# Patient Record
Sex: Female | Born: 1970 | Race: Black or African American | Hispanic: No | Marital: Single | Smoking: Never smoker
Health system: Southern US, Community
[De-identification: ages and names within clinical notes are randomized; demographics above are authoritative.]

## PROBLEM LIST (undated history)

## (undated) DIAGNOSIS — E039 Hypothyroidism, unspecified: Secondary | ICD-10-CM

## (undated) DIAGNOSIS — R319 Hematuria, unspecified: Secondary | ICD-10-CM

## (undated) DIAGNOSIS — K5090 Crohn's disease, unspecified, without complications: Secondary | ICD-10-CM

## (undated) HISTORY — DX: Hypothyroidism, unspecified: E03.9

## (undated) HISTORY — DX: Hematuria, unspecified: R31.9

## (undated) HISTORY — DX: Crohn's disease, unspecified, without complications: K50.90

## (undated) HISTORY — PX: ABDOMINAL HYSTERECTOMY: SHX81

## (undated) HISTORY — PX: BREAST EXCISIONAL BIOPSY: SUR124

---

## 2006-12-14 ENCOUNTER — Ambulatory Visit: Payer: Self-pay | Admitting: Family Medicine

## 2010-07-25 ENCOUNTER — Ambulatory Visit: Payer: Self-pay | Admitting: Obstetrics & Gynecology

## 2010-07-29 ENCOUNTER — Ambulatory Visit: Payer: Self-pay | Admitting: Obstetrics & Gynecology

## 2010-11-06 ENCOUNTER — Ambulatory Visit: Payer: Self-pay | Admitting: Family Medicine

## 2011-11-11 ENCOUNTER — Ambulatory Visit: Payer: Self-pay | Admitting: Family Medicine

## 2012-03-31 ENCOUNTER — Ambulatory Visit: Payer: Self-pay | Admitting: Urology

## 2012-11-03 ENCOUNTER — Ambulatory Visit: Payer: Self-pay | Admitting: Family Medicine

## 2013-03-28 ENCOUNTER — Other Ambulatory Visit (INDEPENDENT_AMBULATORY_CARE_PROVIDER_SITE_OTHER): Payer: BC Managed Care – PPO

## 2013-03-28 ENCOUNTER — Encounter: Payer: Self-pay | Admitting: General Surgery

## 2013-03-28 ENCOUNTER — Ambulatory Visit (INDEPENDENT_AMBULATORY_CARE_PROVIDER_SITE_OTHER): Payer: BC Managed Care – PPO | Admitting: General Surgery

## 2013-03-28 VITALS — BP 136/74 | HR 76 | Resp 14 | Ht 65.0 in | Wt 138.0 lb

## 2013-03-28 DIAGNOSIS — N6459 Other signs and symptoms in breast: Secondary | ICD-10-CM

## 2013-03-28 DIAGNOSIS — N6452 Nipple discharge: Secondary | ICD-10-CM

## 2013-03-28 NOTE — Progress Notes (Signed)
Patient ID: Amber Navarro, female   DOB: [birth year], 43 y.o.   MRN: [id number]  Chief Complaint  Patient presents with  . Follow-up    nipple bleeding    HPI Amber Navarro is a 43 y.o. female here today for an evaluation of left nipple bleeding. She states she noticed this about  Six months ago. Patient states it is dark brown with an red tent. She says it drains daily .  Patient last mammogram was October 19,2014. Patient does not perform self breast checks and get regular mammograms.  HPI  Past Medical History  Diagnosis Date  . Hypothyroidism   . Crohn disease   . Blood in urine     Past Surgical History  Procedure Laterality Date  . Abdominal hysterectomy      Family History  Problem Relation Age of Onset  . Cancer Father     brain   . Hypertension Mother     Social History History  Substance Use Topics  . Smoking status: Never Smoker   . Smokeless tobacco: Never Used  . Alcohol Use: Yes    No Known Allergies  Current Outpatient Prescriptions  Medication Sig Dispense Refill  . folic acid (FOLVITE) 1 MG tablet Take 1 mg by mouth daily.      Marland Kitchen levothyroxine (SYNTHROID, LEVOTHROID) 88 MCG tablet Take 88 mcg by mouth daily before breakfast.      . Mesalamine (DELZICOL) 400 MG CPDR DR capsule Take 400 mg by mouth 2 (two) times daily.       No current facility-administered medications for this visit.    Review of Systems Review of Systems  Constitutional: Negative.   Respiratory: Negative.   Cardiovascular: Negative.     Blood pressure 136/74, pulse 76, resp. rate 14, height 5\' 5"  (1.651 m), weight 138 lb (62.596 kg).  Physical Exam Physical Exam  Constitutional: She is oriented to person, place, and time. She appears well-developed and well-nourished.  Eyes: Conjunctivae are normal.  Neck: Neck supple. No mass and no thyromegaly present.  Cardiovascular: Normal rate, regular rhythm and normal heart sounds.   Pulmonary/Chest: Breath sounds normal.  Right breast exhibits no inverted nipple, no mass, no nipple discharge, no skin change and no tenderness. Left breast exhibits nipple discharge (reddish brown fluid coming from 4 0cl location. Discharge is strongly heme positive.). Left breast exhibits no inverted nipple, no mass, no skin change and no tenderness.  Lymphadenopathy:    She has no cervical adenopathy.    She has no axillary adenopathy.  Neurological: She is alert and oriented to person, place, and time.  Skin: Skin is warm.    Data Reviewed Notes and mammogram  From October 2014 reviewed Ultrasound performed showsmoderately large group of ducts located at the 4 o 'clock location with discharge on light pressure Assessment    Bloody discharge left nipple    Plan    Discussed subareolar ductal excision. Explained procedure, reasons risks/benfits. Pt is agreeable.    Patient's surgery has been scheduled for 04-07-13 at [facility]  Pietro Bonura G 03/29/2013, 11:11 AM

## 2013-03-28 NOTE — Patient Instructions (Addendum)
Follow up appointment to be announced.  Patient's surgery has been scheduled for 04-07-13 at [facility].

## 2013-03-29 ENCOUNTER — Encounter: Payer: Self-pay | Admitting: General Surgery

## 2013-03-29 ENCOUNTER — Other Ambulatory Visit: Payer: Self-pay | Admitting: General Surgery

## 2013-03-29 DIAGNOSIS — N6452 Nipple discharge: Secondary | ICD-10-CM

## 2013-04-03 ENCOUNTER — Ambulatory Visit: Payer: Self-pay | Admitting: General Surgery

## 2013-04-07 ENCOUNTER — Ambulatory Visit: Payer: Self-pay | Admitting: General Surgery

## 2013-04-07 DIAGNOSIS — N6459 Other signs and symptoms in breast: Secondary | ICD-10-CM

## 2013-04-07 HISTORY — PX: BREAST SURGERY: SHX581

## 2013-04-10 ENCOUNTER — Encounter: Payer: Self-pay | Admitting: General Surgery

## 2013-04-11 LAB — PATHOLOGY REPORT

## 2013-04-12 ENCOUNTER — Telehealth: Payer: Self-pay | Admitting: *Deleted

## 2013-04-12 ENCOUNTER — Encounter: Payer: Self-pay | Admitting: General Surgery

## 2013-04-12 NOTE — Telephone Encounter (Signed)
Message copied by Carson Myrtle on Wed Apr 12, 2013 11:28 AM ------      Message from: Christene Lye      Created: Wed Apr 12, 2013 10:26 AM       Please let pt pt know the pathology was normal. 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

## 2013-04-12 NOTE — Telephone Encounter (Signed)
Notified patient as instructed, patient pleased. Discussed follow-up appointments, patient agrees  

## 2013-04-18 ENCOUNTER — Ambulatory Visit (INDEPENDENT_AMBULATORY_CARE_PROVIDER_SITE_OTHER): Payer: BC Managed Care – PPO | Admitting: General Surgery

## 2013-04-18 ENCOUNTER — Encounter: Payer: Self-pay | Admitting: General Surgery

## 2013-04-18 VITALS — BP 128/78 | HR 74 | Resp 12 | Ht 65.0 in | Wt 137.0 lb

## 2013-04-18 DIAGNOSIS — D249 Benign neoplasm of unspecified breast: Secondary | ICD-10-CM

## 2013-04-18 NOTE — Progress Notes (Signed)
This is a 43 year old female here today for her post op left breast subareolar duct excision done on 04/07/13. Patient states she is doing well.   Left breast has a 4cm swelling underneath the incision.  Likely a seroma.  Attempted aspiration. She had very thick old blood not amenable to aspiration.  Advised to use heat and will recheck in two weeks. Pathology report showed intraductal papilloma.

## 2013-04-18 NOTE — Patient Instructions (Signed)
Patient to apply heat to area and return in two weeks.

## 2013-04-26 ENCOUNTER — Ambulatory Visit: Payer: BC Managed Care – PPO | Admitting: General Surgery

## 2013-05-02 ENCOUNTER — Ambulatory Visit (INDEPENDENT_AMBULATORY_CARE_PROVIDER_SITE_OTHER): Payer: BC Managed Care – PPO | Admitting: General Surgery

## 2013-05-02 ENCOUNTER — Encounter: Payer: Self-pay | Admitting: General Surgery

## 2013-05-02 VITALS — BP 120/70 | HR 68 | Resp 12 | Ht 66.0 in | Wt 138.0 lb

## 2013-05-02 DIAGNOSIS — R928 Other abnormal and inconclusive findings on diagnostic imaging of breast: Secondary | ICD-10-CM

## 2013-05-02 DIAGNOSIS — IMO0002 Reserved for concepts with insufficient information to code with codable children: Secondary | ICD-10-CM

## 2013-05-02 NOTE — Patient Instructions (Addendum)
Patient to return in 1 month for follow up. Continue self breast exams. Call office for any new breast issues or concerns.  

## 2013-05-02 NOTE — Progress Notes (Signed)
Patient ID: Amber Navarro, female   DOB: Apr 28, 1970, 43 y.o.   MRN: [id number]  The patient presents for a 2 week post op left breast subareolar duct excision. The procedure was performed on 04/07/13. The patient denies any new problems at this time.  Hematoma noted two weeks ago has gone down considerably. Hopefully this will resolve with out any intervention.  Patient to return in 1 month for follow up.

## 2013-06-06 ENCOUNTER — Ambulatory Visit (INDEPENDENT_AMBULATORY_CARE_PROVIDER_SITE_OTHER): Payer: BC Managed Care – PPO | Admitting: General Surgery

## 2013-06-06 ENCOUNTER — Encounter: Payer: Self-pay | Admitting: General Surgery

## 2013-06-06 VITALS — BP 128/80 | HR 70 | Resp 12 | Ht 66.0 in | Wt 137.0 lb

## 2013-06-06 DIAGNOSIS — D249 Benign neoplasm of unspecified breast: Secondary | ICD-10-CM

## 2013-06-06 NOTE — Patient Instructions (Addendum)
Bilateral screening mammogram in October 2015. Continue self breast exams. Call office for any new breast issues or concerns.

## 2013-06-06 NOTE — Progress Notes (Signed)
Patient ID: Amber Navarro, female   DOB: [birth year], 43 y.o.   MRN: [id number]  Chief Complaint  Patient presents with  . Follow-up    seroma left breast    HPI Amber Navarro is a 43 y.o. female.  Here today for follow up left breast subareolar duct excision April 07 2013. She states the area of postoperative hematoma has reduced and she still uses heating pad occasionally to the area.  HPI  Past Medical History  Diagnosis Date  . Hypothyroidism   . Crohn disease   . Blood in urine     Past Surgical History  Procedure Laterality Date  . Abdominal hysterectomy    . Breast surgery Left April 07 2013    left breast subareolar duct excision    Family History  Problem Relation Age of Onset  . Cancer Father     brain   . Hypertension Mother     Social History History  Substance Use Topics  . Smoking status: Never Smoker   . Smokeless tobacco: Never Used  . Alcohol Use: Yes    No Known Allergies  Current Outpatient Prescriptions  Medication Sig Dispense Refill  . folic acid (FOLVITE) 1 MG tablet Take 1 mg by mouth daily.      Marland Kitchen levothyroxine (SYNTHROID, LEVOTHROID) 88 MCG tablet Take 88 mcg by mouth daily before breakfast.      . Mesalamine (DELZICOL) 400 MG CPDR DR capsule Take 400 mg by mouth 2 (two) times daily.       No current facility-administered medications for this visit.    Review of Systems Review of Systems  Constitutional: Negative.   Respiratory: Negative.   Cardiovascular: Negative.     Blood pressure 128/80, pulse 70, resp. rate 12, height 5\' 6"  (1.676 m), weight 137 lb (62.143 kg).  Physical Exam Physical Exam  Constitutional: She is oriented to person, place, and time. She appears well-developed and well-nourished.  Eyes: Conjunctivae are normal.  Neck: Neck supple.  Pulmonary/Chest: Right breast exhibits no inverted nipple, no mass, no nipple discharge, no skin change and no tenderness. Left breast exhibits no inverted nipple, no  mass, no nipple discharge, no skin change and no tenderness.  Incision well healed.   Lymphadenopathy:    She has no cervical adenopathy.    She has no axillary adenopathy.  Neurological: She is alert and oriented to person, place, and time.  Skin: Skin is warm and dry.    Data Reviewed none  Assessment    Left breast hematoma fully resolved. Intraductal papilloma left breast. No further bloody drainage from nipple.     Plan    Bilateral screening mammogram and office visit in October 2015.       Clifford Benninger G Llewellyn Choplin 06/06/2013, 9:45 AM

## 2013-11-14 ENCOUNTER — Ambulatory Visit: Payer: Self-pay | Admitting: General Surgery

## 2013-11-14 ENCOUNTER — Encounter: Payer: Self-pay | Admitting: General Surgery

## 2013-11-21 ENCOUNTER — Other Ambulatory Visit: Payer: BC Managed Care – PPO

## 2013-11-21 ENCOUNTER — Ambulatory Visit (INDEPENDENT_AMBULATORY_CARE_PROVIDER_SITE_OTHER): Payer: BC Managed Care – PPO | Admitting: General Surgery

## 2013-11-21 ENCOUNTER — Encounter: Payer: Self-pay | Admitting: General Surgery

## 2013-11-21 VITALS — BP 134/80 | HR 66 | Resp 14 | Ht 66.0 in | Wt 143.0 lb

## 2013-11-21 DIAGNOSIS — N6452 Nipple discharge: Secondary | ICD-10-CM

## 2013-11-21 NOTE — Progress Notes (Signed)
Patient ID: Amber Navarro, female   DOB: 1970/03/06, 43 y.o.   MRN: [id number]  Chief Complaint  Patient presents with  . Follow-up    mammogram    HPI Amber Navarro is a 43 y.o. female who presents for a breast evaluation. The most recent mammogram was done on 11/14/13. Patient does perform regular self breast checks and gets regular mammograms done.  The patient denies any new problems with the breasts at this time.  She had excision of intaductal papilloma left breast earlier this yr.  HPI  Past Medical History  Diagnosis Date  . Hypothyroidism   . Crohn disease   . Blood in urine     Past Surgical History  Procedure Laterality Date  . Abdominal hysterectomy    . Breast surgery Left April 07 2013    left breast subareolar duct excision    Family History  Problem Relation Age of Onset  . Cancer Father     brain   . Hypertension Mother     Social History History  Substance Use Topics  . Smoking status: Never Smoker   . Smokeless tobacco: Never Used  . Alcohol Use: Yes    No Known Allergies  Current Outpatient Prescriptions  Medication Sig Dispense Refill  . folic acid (FOLVITE) 1 MG tablet Take 1 mg by mouth daily.      Marland Kitchen levothyroxine (SYNTHROID, LEVOTHROID) 88 MCG tablet Take 88 mcg by mouth daily before breakfast.      . Mesalamine (DELZICOL) 400 MG CPDR DR capsule Take 400 mg by mouth 2 (two) times daily.       No current facility-administered medications for this visit.    Review of Systems Review of Systems  Constitutional: Negative.   Respiratory: Negative.   Cardiovascular: Negative.     Blood pressure 134/80, pulse 66, resp. rate 14, height 5\' 6"  (1.676 m), weight 143 lb (64.864 kg).  Physical Exam Physical Exam  Constitutional: She is oriented to person, place, and time. She appears well-developed and well-nourished.  Eyes: Conjunctivae are normal. No scleral icterus.  Neck: Neck supple. No thyromegaly present.  Cardiovascular:  Normal rate, regular rhythm and normal heart sounds.   No murmur heard. Pulmonary/Chest: Effort normal and breath sounds normal. Right breast exhibits nipple discharge (watery coming from upper inner quadrant). Right breast exhibits no inverted nipple, no mass, no skin change and no tenderness. Left breast exhibits no inverted nipple, no mass, no nipple discharge, no skin change and no tenderness.  Lymphadenopathy:    She has no cervical adenopathy.    She has no axillary adenopathy.  Neurological: She is alert and oriented to person, place, and time.  Skin: Skin is warm and dry.    Data Reviewed Mammogram reviewed and stable. Ultrasound of right breast upper inner quadrant was normal.   Assessment    Right nipple discharge present on exam. Ultrasound was normal.     Plan    Short term follow up.        SANKAR,SEEPLAPUTHUR G 11/22/2013, 6:19 AM

## 2013-11-21 NOTE — Patient Instructions (Addendum)
Patient to return in 2 months for follow up. Continue self breast exams. Call office for any new breast issues or concerns.

## 2013-11-22 ENCOUNTER — Encounter: Payer: Self-pay | Admitting: General Surgery

## 2013-11-27 ENCOUNTER — Encounter: Payer: Self-pay | Admitting: General Surgery

## 2014-01-22 ENCOUNTER — Ambulatory Visit: Payer: BC Managed Care – PPO | Admitting: General Surgery

## 2014-02-01 ENCOUNTER — Ambulatory Visit: Payer: BC Managed Care – PPO | Admitting: General Surgery

## 2014-02-01 ENCOUNTER — Ambulatory Visit (INDEPENDENT_AMBULATORY_CARE_PROVIDER_SITE_OTHER): Payer: BLUE CROSS/BLUE SHIELD | Admitting: General Surgery

## 2014-02-01 ENCOUNTER — Encounter: Payer: Self-pay | Admitting: General Surgery

## 2014-02-01 VITALS — BP 144/84 | HR 60 | Resp 12 | Ht 65.5 in | Wt 144.0 lb

## 2014-02-01 DIAGNOSIS — N6452 Nipple discharge: Secondary | ICD-10-CM

## 2014-02-01 NOTE — Progress Notes (Addendum)
Patient ID: Amber Navarro, female   DOB: August 01, 1970, 44 y.o.   MRN: [id number]  Chief Complaint  Patient presents with  . Follow-up    nipple discharge    HPI Amber Navarro is a 44 y.o. female.  Here today for her 2 month follow up right nipple discharge. She states the drainage is with palpation. Denies pain. One year ago she had excision of a duct on the left showing intraductal papilloma. She reports watery right nipple discharge 2 months ago. The ultrasound was normal. She currently has shingles on her left flank area with a recent diagnosis.Marland Kitchen  HPI  Past Medical History  Diagnosis Date  . Hypothyroidism   . Crohn disease   . Blood in urine     Past Surgical History  Procedure Laterality Date  . Abdominal hysterectomy    . Breast surgery Left April 07 2013    left breast subareolar duct excision    Family History  Problem Relation Age of Onset  . Cancer Father     brain   . Hypertension Mother     Social History History  Substance Use Topics  . Smoking status: Never Smoker   . Smokeless tobacco: Never Used  . Alcohol Use: Yes    No Known Allergies  Current Outpatient Prescriptions  Medication Sig Dispense Refill  . folic acid (FOLVITE) 1 MG tablet Take 1 mg by mouth daily.    Marland Kitchen levothyroxine (SYNTHROID, LEVOTHROID) 88 MCG tablet Take 88 mcg by mouth daily before breakfast.    . Mesalamine (DELZICOL) 400 MG CPDR DR capsule Take 400 mg by mouth 2 (two) times daily.     No current facility-administered medications for this visit.    Review of Systems Review of Systems  Constitutional: Negative.   Respiratory: Negative.   Cardiovascular: Negative.     Blood pressure 144/84, pulse 60, resp. rate 12, height 5' 5.5" (1.664 m), weight 144 lb (65.318 kg).  Physical Exam Physical Exam  Constitutional: She is oriented to person, place, and time. She appears well-developed and well-nourished.  Eyes: Conjunctivae are normal. No scleral icterus.  Neck:  Neck supple.  Cardiovascular: Normal rate, regular rhythm and normal heart sounds.   Pulmonary/Chest: Effort normal and breath sounds normal. Right breast exhibits nipple discharge. Right breast exhibits no inverted nipple, no mass, no skin change and no tenderness. Left breast exhibits no inverted nipple, no mass, no nipple discharge, no skin change and no tenderness.  Watery discharge located at the upper inner quadrant right nipple.  Lymphadenopathy:    She has no cervical adenopathy.    She has no axillary adenopathy.  Neurological: She is alert and oriented to person, place, and time.  Skin: Skin is warm and dry.    Data Reviewed Office notes and previous pathology.  Assessment    Right nipple discharge, cytology obtained.    Plan    Excision of subareolar duct upper inner quadrant. Discussed fully with pt and she is agreeable.  Patient's surgery has been scheduled for 03-08-13 at [facility].    SANKAR,SEEPLAPUTHUR G 02/02/2014, 5:32 AM

## 2014-02-01 NOTE — Patient Instructions (Addendum)
Continue self breast exams. Call office for any new breast issues or concerns.  Patient's surgery has been scheduled for 03-08-13 at [facility].

## 2014-02-02 ENCOUNTER — Encounter: Payer: Self-pay | Admitting: General Surgery

## 2014-02-23 ENCOUNTER — Other Ambulatory Visit: Payer: Self-pay | Admitting: General Surgery

## 2014-02-23 DIAGNOSIS — N6452 Nipple discharge: Secondary | ICD-10-CM

## 2014-03-02 ENCOUNTER — Ambulatory Visit: Payer: Self-pay | Admitting: Anesthesiology

## 2014-03-02 LAB — POTASSIUM: POTASSIUM: 3.4 mmol/L — AB (ref 3.5–5.1)

## 2014-03-08 ENCOUNTER — Ambulatory Visit: Payer: Self-pay | Admitting: General Surgery

## 2014-03-08 ENCOUNTER — Encounter: Payer: Self-pay | Admitting: General Surgery

## 2014-03-08 DIAGNOSIS — N6452 Nipple discharge: Secondary | ICD-10-CM

## 2014-03-08 HISTORY — PX: BREAST MASS EXCISION: SHX1267

## 2014-03-09 ENCOUNTER — Encounter: Payer: Self-pay | Admitting: General Surgery

## 2014-03-15 ENCOUNTER — Encounter: Payer: Self-pay | Admitting: General Surgery

## 2014-03-15 ENCOUNTER — Ambulatory Visit (INDEPENDENT_AMBULATORY_CARE_PROVIDER_SITE_OTHER): Payer: BLUE CROSS/BLUE SHIELD | Admitting: General Surgery

## 2014-03-15 VITALS — BP 118/72 | HR 78 | Resp 12 | Ht 65.0 in | Wt 150.0 lb

## 2014-03-15 DIAGNOSIS — Z87898 Personal history of other specified conditions: Secondary | ICD-10-CM

## 2014-03-15 DIAGNOSIS — Z86018 Personal history of other benign neoplasm: Secondary | ICD-10-CM

## 2014-03-15 NOTE — Patient Instructions (Signed)
Patient to return in October 2016 with bilateral screening mammogram.  Continue self breast exams. Call office for any new breast issues or concerns.

## 2014-03-15 NOTE — Progress Notes (Signed)
This is a 44 year old female here today for her post op right breast excision done on 03/08/14. Patient states she is doing well. Right breast incision looks clean and healing well. Path showed a dilated duct , adenosis, PASH.  Patient to return in October 2016 with bilateral screening mammogram.

## 2014-05-19 NOTE — Op Note (Signed)
PATIENT NAME:  Amber Navarro, Amber Navarro MR#:  [id number] DATE OF BIRTH:  18-Jan-1971  DATE OF PROCEDURE:  04/07/2013  PREOPERATIVE DIAGNOSIS:  Bloody discharge left nipple.   POSTOPERATIVE DIAGNOSIS:  Bloody discharge left nipple with dilated ducts located at the 4 to 5 o'clock location.   OPERATION PERFORMED:  Excision of subareolar ductal lesion with ultrasound guidance.   SURGEON:  Mckinley Jewel, M.D.   ANESTHESIA:  General with an LMA.   COMPLICATIONS:  None.   ESTIMATED BLOOD LOSS:  Minimal.   DRAINS:  None.   PROCEDURE:  The patient was put to sleep with an LMA.  The left breast was prepped and draped out as a sterile field.  Timeout was performed.  Ultrasound probe was brought up to the field and the location of the dilated ducts identified at the 4 to 5 o'clock location in the retroareolar region.  This area was marked with a skin incision.  It was then mapped from about the 3 to 6 o'clock location along the areolar margin.  10 mL of 0.5% Marcaine was instilled for postop analgesia.  A skin incision was made from the 3 to 6 o'clock location in the lower outer quadrant and the areolar margin.  The areolar skin was then lifted all the way up to the nipple and the skin and subcutaneous tissue elevated on the lateral aspect.  From the nipple region extending out toward the periphery, excision was then performed containing the dilated ducts and completely removed.  The ductal tissue was examined and noted it did contain this bloodstained fluid with it.  It was sent in formalin for pathology.  Then ensuring hemostasis with cautery, the deeper tissue was reapproximated with 2-0 Vicryl and the skin was then closed with subcuticular 4-0 Monocryl covered with Dermabond.  Procedure was well tolerated.  She was subsequently returned to the recovery room in stable condition.    ____________________________ S.Robinette Haines, MD sgs:ea D: 04/07/2013 15:26:06 ET T: 04/07/2013 23:05:32  ET JOB#: [id number]  cc: Synthia Innocent. Jamal Collin, MD, <Dictator> [facility] Robinette Haines MD ELECTRONICALLY SIGNED 04/11/2013 7:15

## 2014-05-21 LAB — SURGICAL PATHOLOGY

## 2014-05-27 NOTE — Op Note (Signed)
PATIENT NAME:  Amber Navarro, YOTT MR#:  [id number] DATE OF BIRTH:  08-01-1970  DATE OF PROCEDURE:  03/08/2014  PREOPERATIVE DIAGNOSIS: Watery discharge, right nipple.   POSTOPERATIVE DIAGNOSIS: Watery discharge, right nipple.   OPERATION: Right breast subareolar duct excision upper inner quadrant.   ANESTHESIA: General.   COMPLICATIONS: None.   ESTIMATED BLOOD LOSS: Minimal.   DRAINS: None.   DESCRIPTION OF PROCEDURE: The patient was put to sleep with an LMA. The right breast was prepped and draped out as a sterile field. A timeout was performed. By prior exam, the location of the drainage was in the upper inner quadrant of the subareolar tissue, but the ultrasound had failed to reveal any abnormal ductal elements. The finding was reproducible in that location. Accordingly, an incision was mapped from the 12 o'clock to 3 o'clock position and 6 mL of 0.5% Marcaine was instilled for postoperative analgesia. A skin incision was made. The areolar skin was then lifted up towards the nipple with cautery used for control of bleeding and the undersurface of the nipple was freed. The tissue in this quadrant was then excised out. Grossly, it did not show any palpable or visible masses. After ensuring hemostasis, the deeper tissue was closed with interrupted 3-0 Vicryl and the skin with subcuticular 4-0 Vicryl covered with LiquiBand. A Telfa and Tegaderm dressing was placed over this. The excised tissue was sent in formalin for pathology. The patient subsequently was extubated and returned to the recovery room in stable condition.    ____________________________ S.Robinette Haines, MD sgs:at D: 03/08/2014 09:38:43 ET T: 03/08/2014 14:41:44 ET JOB#: [id number]  cc: Synthia Innocent. Jamal Collin, MD, <Dictator> [facility] Robinette Haines MD ELECTRONICALLY SIGNED 03/12/2014 9:22

## 2014-09-10 ENCOUNTER — Other Ambulatory Visit: Payer: Self-pay

## 2014-09-10 DIAGNOSIS — Z1231 Encounter for screening mammogram for malignant neoplasm of breast: Secondary | ICD-10-CM

## 2014-11-16 ENCOUNTER — Ambulatory Visit: Payer: Self-pay

## 2014-11-22 ENCOUNTER — Other Ambulatory Visit: Payer: Self-pay | Admitting: Family Medicine

## 2014-11-22 ENCOUNTER — Ambulatory Visit: Payer: BLUE CROSS/BLUE SHIELD | Admitting: General Surgery

## 2014-11-22 DIAGNOSIS — Z1231 Encounter for screening mammogram for malignant neoplasm of breast: Secondary | ICD-10-CM

## 2014-11-27 ENCOUNTER — Ambulatory Visit
Admission: RE | Admit: 2014-11-27 | Discharge: 2014-11-27 | Disposition: A | Discharge status: Home / Self Care | Payer: BLUE CROSS/BLUE SHIELD | Source: Ambulatory Visit | Attending: Family Medicine | Admitting: Family Medicine

## 2014-11-27 DIAGNOSIS — Z1231 Encounter for screening mammogram for malignant neoplasm of breast: Secondary | ICD-10-CM | POA: Insufficient documentation

## 2014-11-27 DIAGNOSIS — R922 Inconclusive mammogram: Secondary | ICD-10-CM | POA: Insufficient documentation

## 2014-11-30 ENCOUNTER — Other Ambulatory Visit: Payer: Self-pay | Admitting: Family Medicine

## 2014-11-30 DIAGNOSIS — R928 Other abnormal and inconclusive findings on diagnostic imaging of breast: Secondary | ICD-10-CM

## 2014-12-03 ENCOUNTER — Ambulatory Visit: Payer: BLUE CROSS/BLUE SHIELD

## 2014-12-03 ENCOUNTER — Ambulatory Visit: Payer: BLUE CROSS/BLUE SHIELD | Admitting: General Surgery

## 2014-12-03 ENCOUNTER — Other Ambulatory Visit: Payer: BLUE CROSS/BLUE SHIELD

## 2014-12-04 ENCOUNTER — Ambulatory Visit
Admission: RE | Admit: 2014-12-04 | Discharge: 2014-12-04 | Disposition: A | Discharge status: Home / Self Care | Payer: BLUE CROSS/BLUE SHIELD | Source: Ambulatory Visit | Attending: Family Medicine | Admitting: Family Medicine

## 2014-12-04 DIAGNOSIS — R928 Other abnormal and inconclusive findings on diagnostic imaging of breast: Secondary | ICD-10-CM | POA: Insufficient documentation

## 2014-12-11 ENCOUNTER — Ambulatory Visit (INDEPENDENT_AMBULATORY_CARE_PROVIDER_SITE_OTHER): Payer: BLUE CROSS/BLUE SHIELD | Admitting: General Surgery

## 2014-12-11 ENCOUNTER — Encounter: Payer: Self-pay | Admitting: General Surgery

## 2014-12-11 VITALS — BP 116/68 | HR 72 | Resp 12 | Ht 66.0 in | Wt 143.0 lb

## 2014-12-11 DIAGNOSIS — Z87898 Personal history of other specified conditions: Secondary | ICD-10-CM

## 2014-12-11 DIAGNOSIS — Z86018 Personal history of other benign neoplasm: Secondary | ICD-10-CM

## 2014-12-11 NOTE — Progress Notes (Signed)
Patient ID: Amber Navarro, female   DOB: April 27, 1970, 44 y.o.   MRN: [id number]  Chief Complaint  Patient presents with  . Follow-up    mammogram    HPI Amber Navarro is a 44 y.o. female who presents for a breast evaluation. The most recent mammogram was done on 11/27/14 and added views on 12/04/14.  Patient does perform regular self breast checks and gets regular mammograms done.  Denies any recent nipple discharge.  I have reviewed the history of present illness with the patient. HPI  Past Medical History  Diagnosis Date  . Hypothyroidism   . Crohn disease ([place])   . Blood in urine     Past Surgical History  Procedure Laterality Date  . Abdominal hysterectomy    . Breast surgery Left April 07 2013    left breast subareolar duct excision  . Breast mass excision Right 03/08/14  . Breast excisional biopsy Right     negative 2015  . Breast excisional biopsy Left     2014 negative    Family History  Problem Relation Age of Onset  . Cancer Father     brain   . Hypertension Mother   . Breast cancer Neg Hx     Social History Social History  Substance Use Topics  . Smoking status: Never Smoker   . Smokeless tobacco: Never Used  . Alcohol Use: Yes    No Known Allergies  Current Outpatient Prescriptions  Medication Sig Dispense Refill  . folic acid (FOLVITE) 1 MG tablet Take 1 mg by mouth daily.    Marland Kitchen levothyroxine (SYNTHROID, LEVOTHROID) 88 MCG tablet Take 88 mcg by mouth daily before breakfast.    . Mesalamine (DELZICOL) 400 MG CPDR DR capsule Take 400 mg by mouth 2 (two) times daily.     No current facility-administered medications for this visit.    Review of Systems Review of Systems  Constitutional: Negative.   Respiratory: Negative.   Cardiovascular: Negative.     Blood pressure 116/68, pulse 72, resp. rate 12, height 5\' 6"  (1.676 m), weight 143 lb (64.864 kg).  Physical Exam Physical Exam  Constitutional: She is oriented to person, place, and time.  She appears well-developed and well-nourished.  HENT:  Head: Normocephalic.  Eyes: Conjunctivae are normal. No scleral icterus.  Neck: Neck supple.  Cardiovascular: Normal rate, regular rhythm and normal heart sounds.   Pulmonary/Chest: Effort normal and breath sounds normal. Right breast exhibits no inverted nipple, no mass, no nipple discharge, no skin change and no tenderness. Left breast exhibits no inverted nipple, no mass, no nipple discharge, no skin change and no tenderness.  Right breast excision site well healed, scar barely noticeable.   Abdominal: Soft. Bowel sounds are normal. There is no hepatomegaly. There is no tenderness.  Lymphadenopathy:    She has no cervical adenopathy.    She has no axillary adenopathy.  Neurological: She is alert and oriented to person, place, and time.  Skin: Skin is warm and dry.  Psychiatric: She has a normal mood and affect. Her behavior is normal.    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 No nipple discharge or other breast symptoms since.      Plan    The patient has been asked to return to the office in one year with a bilateral screening mammogram     PCP:  Ezra Sites  12/12/2014, 11:50 AM

## 2014-12-11 NOTE — Patient Instructions (Signed)
The patient has been asked to return to the office in one year with a bilateral screening mammogram. 

## 2014-12-12 ENCOUNTER — Encounter: Payer: Self-pay | Admitting: General Surgery

## 2015-03-16 IMAGING — MG MM DIGITAL SCREENING BILAT W/ CAD
1 series · 4 of 4 positions shown · non-contrast
Comparison: Previous exam(s).

CLINICAL DATA: Screening.

EXAM:
DIGITAL SCREENING BILATERAL MAMMOGRAM WITH CAD

[R CC · right · 4 of 4 slices shown]
[im 1/4]
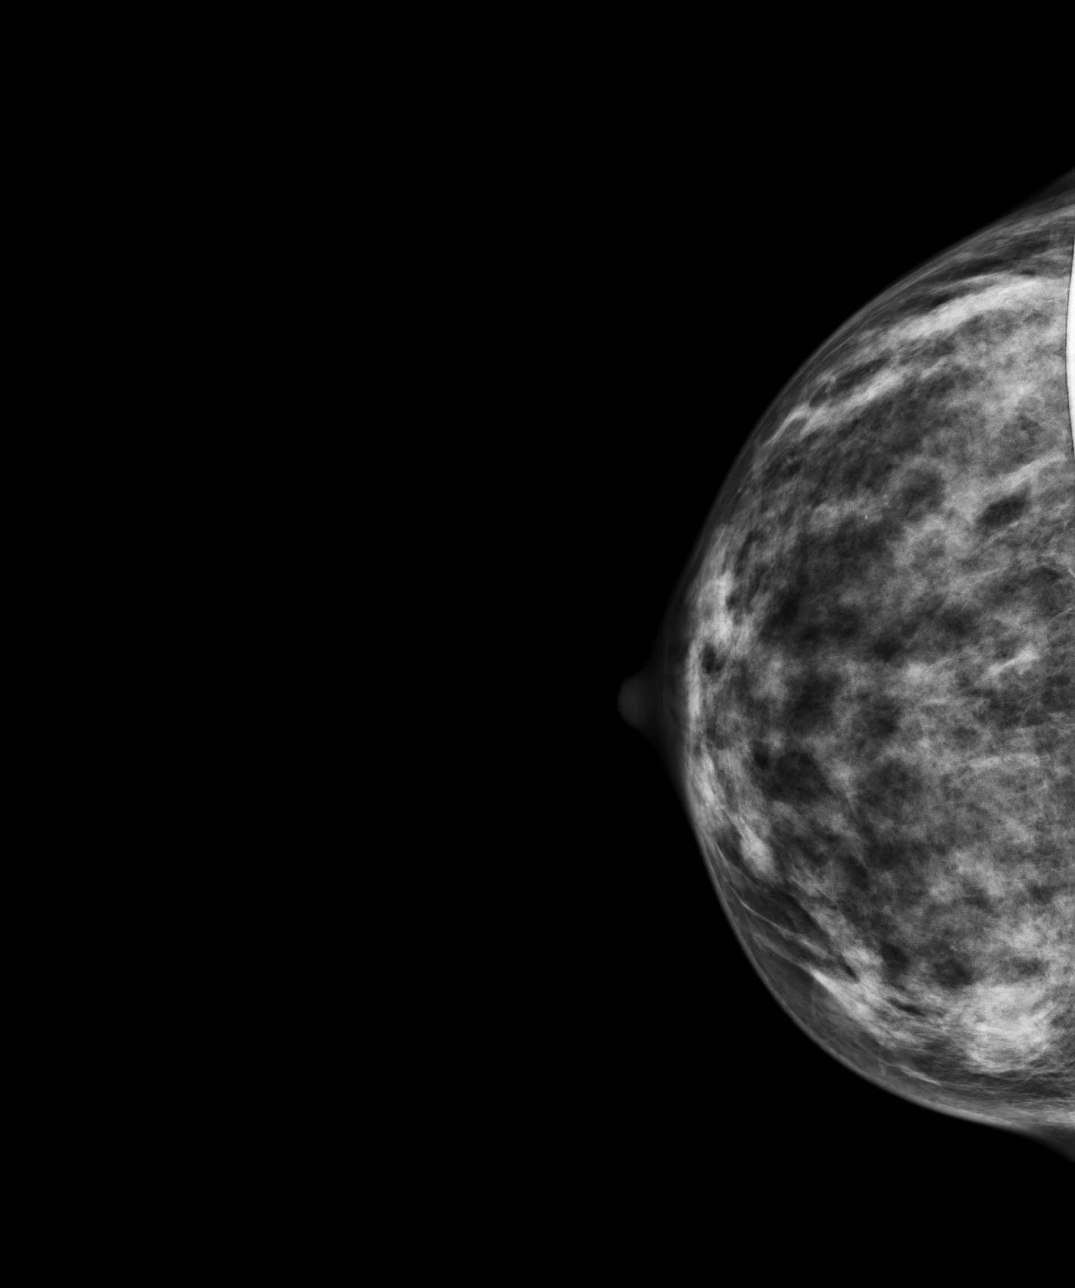
[im 2/4]
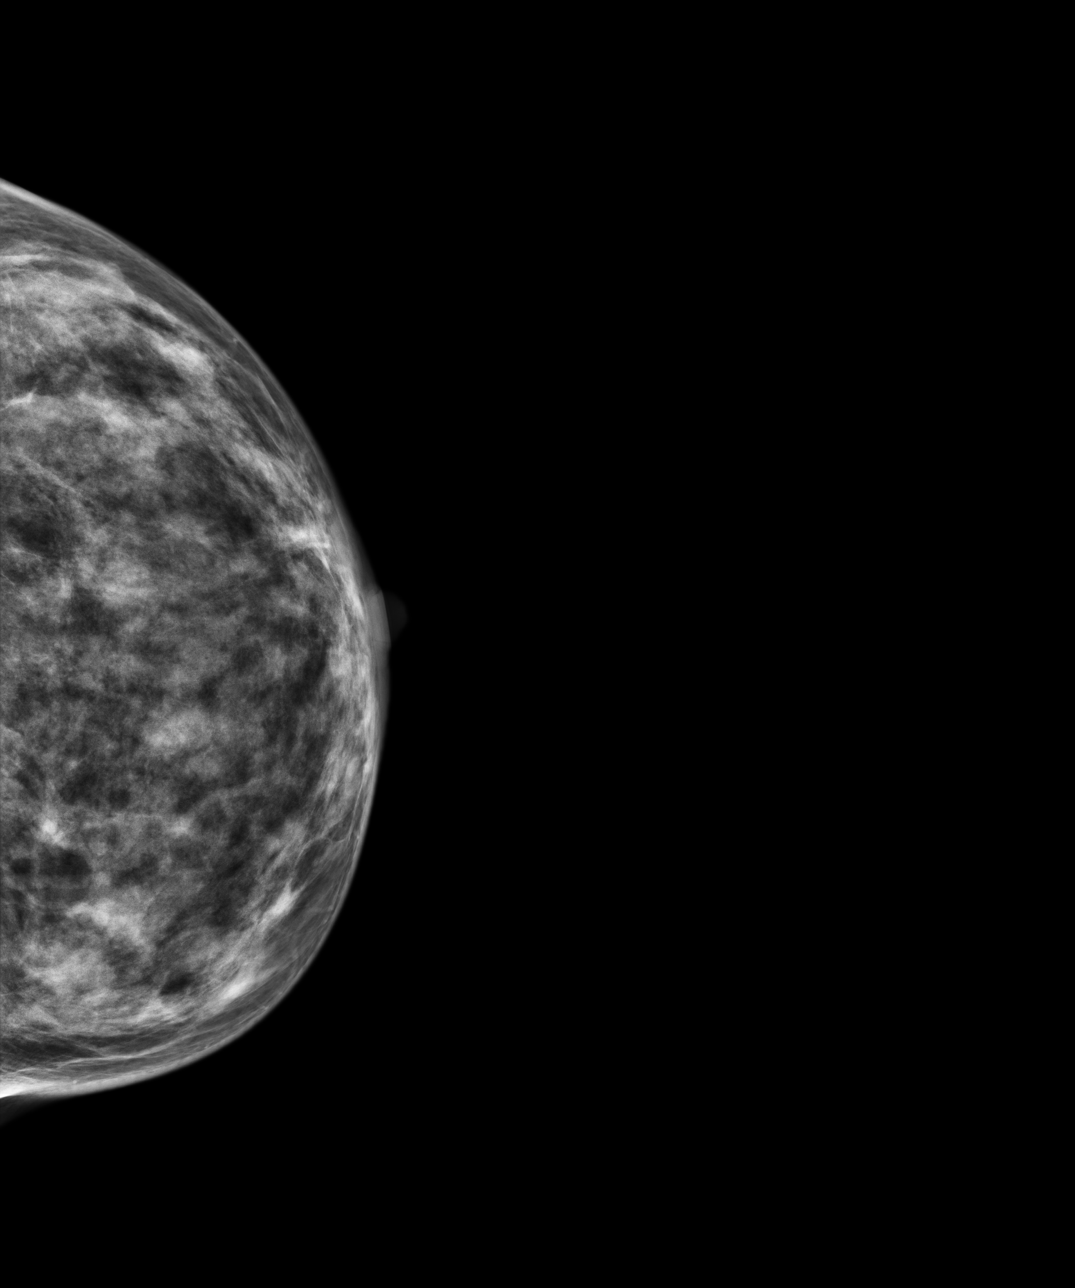
[im 3/4]
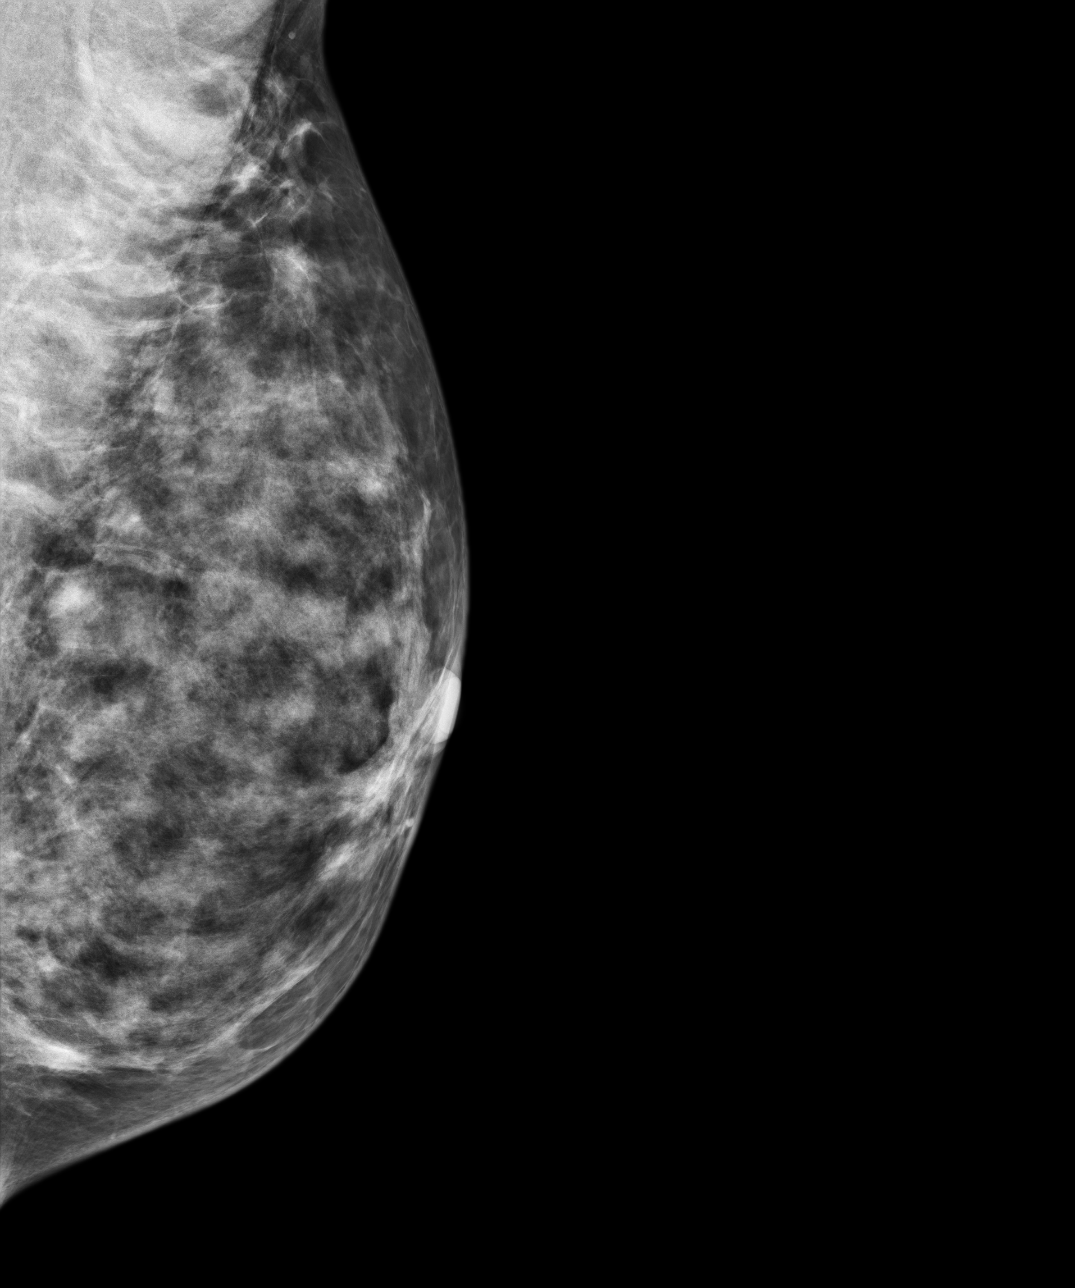
[im 4/4]
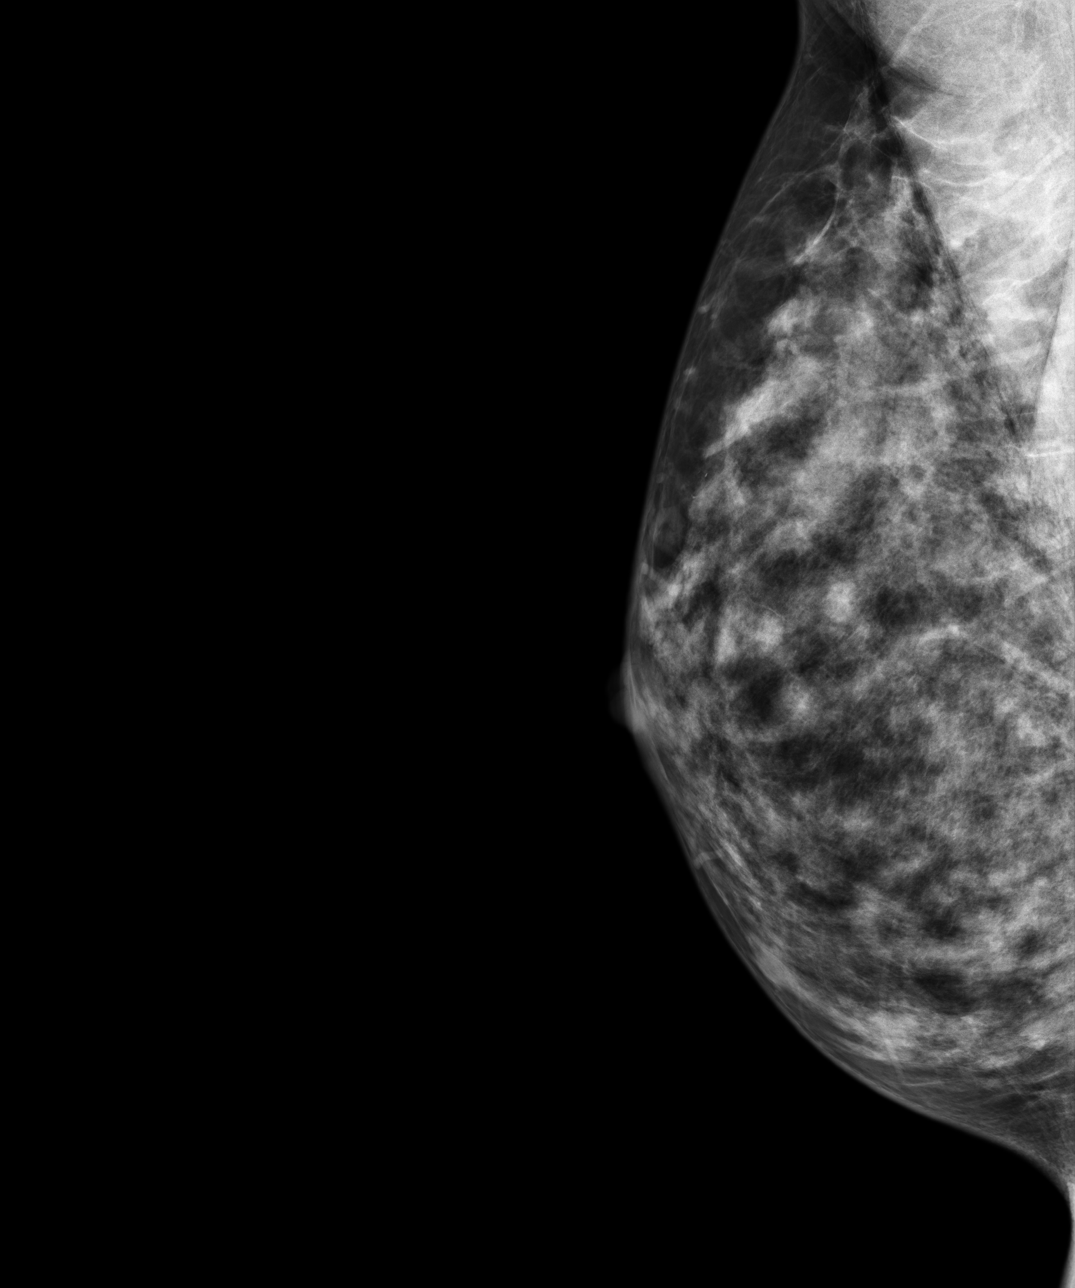

[4 of 4 positions shown; findings below may reference images not displayed]

ACR Breast Density Category d: The breast tissue is extremely dense,
which lowers the sensitivity of mammography.
FINDINGS: There are no findings suspicious for malignancy. Images were
processed with CAD.
IMPRESSION: No mammographic evidence of malignancy. A result letter of this
screening mammogram will be mailed directly to the patient.

RECOMMENDATION:
Screening mammogram in one year. (Code:BD-D-K0F)

BI-RADS CATEGORY  1: Negative.

## 2015-11-05 ENCOUNTER — Other Ambulatory Visit: Payer: Self-pay | Admitting: General Surgery

## 2015-11-05 DIAGNOSIS — Z1239 Encounter for other screening for malignant neoplasm of breast: Secondary | ICD-10-CM

## 2015-11-12 ENCOUNTER — Encounter: Payer: Self-pay | Admitting: *Deleted

## 2015-11-15 ENCOUNTER — Telehealth: Payer: Self-pay | Admitting: *Deleted

## 2015-11-15 NOTE — Telephone Encounter (Signed)
Patient is currently scheduled for a mammogram in November. She called because she is now getting them through her work and they set up the appointment. She wants to know what she needs to do since we normally order her mammogram

## 2015-11-18 NOTE — Telephone Encounter (Signed)
Changed mammogram date to 12/04/15. Will keep same follow up visit here. Other mammogram cancelled.

## 2015-11-19 ENCOUNTER — Other Ambulatory Visit: Payer: Self-pay | Admitting: Family Medicine

## 2015-11-19 DIAGNOSIS — Z1231 Encounter for screening mammogram for malignant neoplasm of breast: Secondary | ICD-10-CM

## 2015-12-04 ENCOUNTER — Ambulatory Visit
Admission: RE | Admit: 2015-12-04 | Discharge: 2015-12-04 | Disposition: A | Discharge status: Home / Self Care | Payer: BLUE CROSS/BLUE SHIELD | Source: Ambulatory Visit | Attending: Family Medicine | Admitting: Family Medicine

## 2015-12-04 DIAGNOSIS — Z1231 Encounter for screening mammogram for malignant neoplasm of breast: Secondary | ICD-10-CM | POA: Insufficient documentation

## 2015-12-09 ENCOUNTER — Ambulatory Visit: Payer: BLUE CROSS/BLUE SHIELD

## 2015-12-17 ENCOUNTER — Encounter: Payer: Self-pay | Admitting: *Deleted

## 2015-12-18 ENCOUNTER — Ambulatory Visit (INDEPENDENT_AMBULATORY_CARE_PROVIDER_SITE_OTHER): Payer: BLUE CROSS/BLUE SHIELD | Admitting: General Surgery

## 2015-12-18 ENCOUNTER — Encounter: Payer: Self-pay | Admitting: General Surgery

## 2015-12-18 VITALS — BP 124/76 | HR 72 | Resp 12 | Ht 66.0 in | Wt 145.0 lb

## 2015-12-18 DIAGNOSIS — Z86018 Personal history of other benign neoplasm: Secondary | ICD-10-CM | POA: Diagnosis not present

## 2015-12-18 NOTE — Progress Notes (Signed)
Patient ID: Amber Navarro, female   DOB: 1970-05-15, 45 y.o.   MRN: [id number]  Chief Complaint  Patient presents with  . Follow-up    mammogram    HPI Amber Navarro is a 45 y.o. female.  who presents for a breast evaluation. The most recent mammogram was done on 12-04-15.  Patient does perform regular self breast checks and gets regular mammograms done.  No new breast issues. I have reviewed the history of present illness with the patient.   HPI  Past Medical History:  Diagnosis Date  . Blood in urine   . Crohn disease ([place])   . Hypothyroidism     Past Surgical History:  Procedure Laterality Date  . ABDOMINAL HYSTERECTOMY    . BREAST EXCISIONAL BIOPSY Right    negative 2015  . BREAST EXCISIONAL BIOPSY Left    2014 negative  . BREAST MASS EXCISION Right 03/08/14  . BREAST SURGERY Left April 07 2013   left breast subareolar duct excision    Family History  Problem Relation Age of Onset  . Cancer Father     brain   . Hypertension Mother   . Breast cancer Neg Hx     Social History Social History  Substance Use Topics  . Smoking status: Never Smoker  . Smokeless tobacco: Never Used  . Alcohol use Yes    No Known Allergies  Current Outpatient Prescriptions  Medication Sig Dispense Refill  . folic acid (FOLVITE) 1 MG tablet Take 1 mg by mouth daily.    Marland Kitchen levothyroxine (SYNTHROID, LEVOTHROID) 88 MCG tablet Take 88 mcg by mouth daily before breakfast.     No current facility-administered medications for this visit.     Review of Systems Review of Systems  Constitutional: Negative.   Respiratory: Negative.   Cardiovascular: Negative.     Blood pressure 124/76, pulse 72, resp. rate 12, height 5\' 6"  (1.676 m), weight 145 lb (65.8 kg).  Physical Exam Physical Exam  Constitutional: She is oriented to person, place, and time. She appears well-developed and well-nourished.  HENT:  Mouth/Throat: Oropharynx is clear and moist.  Eyes: Conjunctivae are  normal. No scleral icterus.  Neck: Neck supple.  Cardiovascular: Normal rate, regular rhythm and normal heart sounds.   Pulmonary/Chest: Effort normal and breath sounds normal. Right breast exhibits no inverted nipple, no mass, no nipple discharge, no skin change and no tenderness. Left breast exhibits no inverted nipple, no mass, no nipple discharge, no skin change and no tenderness.  Abdominal: Soft. Bowel sounds are normal. There is no tenderness. A hernia is present.  Small umbilical hernia.  Lymphadenopathy:    She has no cervical adenopathy.    She has no axillary adenopathy.  Neurological: She is alert and oriented to person, place, and time.  Skin: Skin is warm and dry.  Psychiatric: Her behavior is normal.    Data Reviewed Mammogram reviewed and stable.  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     Plan    The patient has been asked to return to the office in one year with a bilateral screening mammogram        This information has been scribed by Amber Fetch RN, BSN,BC. Marland Kitchen   Amber Navarro G 12/24/2015, 8:13 AM

## 2015-12-18 NOTE — Patient Instructions (Addendum)
The patient has been asked to return to the office in one year with a bilateral screeningmammogram.The patient is aware to call back for any questions or concerns.  

## 2015-12-24 ENCOUNTER — Encounter: Payer: Self-pay | Admitting: General Surgery

## 2016-11-26 ENCOUNTER — Other Ambulatory Visit: Payer: Self-pay | Admitting: Family Medicine

## 2016-11-26 DIAGNOSIS — Z1231 Encounter for screening mammogram for malignant neoplasm of breast: Secondary | ICD-10-CM

## 2016-12-10 ENCOUNTER — Ambulatory Visit
Admission: RE | Admit: 2016-12-10 | Discharge: 2016-12-10 | Disposition: A | Discharge status: Home / Self Care | Payer: BLUE CROSS/BLUE SHIELD | Source: Ambulatory Visit | Attending: Family Medicine | Admitting: Family Medicine

## 2016-12-10 DIAGNOSIS — Z1231 Encounter for screening mammogram for malignant neoplasm of breast: Secondary | ICD-10-CM | POA: Insufficient documentation

## 2016-12-24 ENCOUNTER — Ambulatory Visit: Payer: BLUE CROSS/BLUE SHIELD | Admitting: General Surgery

## 2016-12-28 ENCOUNTER — Encounter: Payer: Self-pay | Admitting: General Surgery

## 2016-12-28 ENCOUNTER — Ambulatory Visit (INDEPENDENT_AMBULATORY_CARE_PROVIDER_SITE_OTHER): Payer: BLUE CROSS/BLUE SHIELD | Admitting: General Surgery

## 2016-12-28 VITALS — BP 120/74 | HR 72 | Resp 12 | Ht 65.0 in | Wt 153.0 lb

## 2016-12-28 DIAGNOSIS — D241 Benign neoplasm of right breast: Secondary | ICD-10-CM | POA: Diagnosis not present

## 2016-12-28 NOTE — Patient Instructions (Signed)
Patient will be asked to return to her PCP  in one year with a bilateral screening mammogram.The patient is aware to call back for any questions or concerns.  

## 2016-12-28 NOTE — Progress Notes (Signed)
Patient ID: Amber Navarro, female   DOB: [birth year], 45 y.o.   MRN: [id number]  Chief Complaint  Patient presents with  . Follow-up    HPI Amber Navarro is a 46 y.o. female who presents for a breast evaluation. The most recent mammogram was done on 12/10/2016. Marland Kitchen  Patient does perform regular self breast checks and gets regular mammograms done.    HPI  Past Medical History:  Diagnosis Date  . Blood in urine   . Crohn disease ([place])   . Hypothyroidism     Past Surgical History:  Procedure Laterality Date  . ABDOMINAL HYSTERECTOMY    . BREAST EXCISIONAL BIOPSY Right    negative 2015  . BREAST EXCISIONAL BIOPSY Left    2014 negative  . BREAST MASS EXCISION Right 03/08/14  . BREAST SURGERY Left April 07 2013   left breast subareolar duct excision    Family History  Problem Relation Age of Onset  . Cancer Father        brain   . Hypertension Mother   . Breast cancer Neg Hx     Social History Social History   Tobacco Use  . Smoking status: Never Smoker  . Smokeless tobacco: Never Used  Substance Use Topics  . Alcohol use: Yes  . Drug use: No    No Known Allergies  Current Outpatient Medications  Medication Sig Dispense Refill  . folic acid (FOLVITE) 1 MG tablet Take 1 mg by mouth daily.    . hydrochlorothiazide (HYDRODIURIL) 25 MG tablet Take 25 mg by mouth daily.    Marland Kitchen levothyroxine (SYNTHROID, LEVOTHROID) 88 MCG tablet Take 88 mcg by mouth daily before breakfast.    . Mesalamine (DELZICOL) 400 MG CPDR DR capsule Take 400 mg by mouth 2 (two) times daily.     No current facility-administered medications for this visit.     Review of Systems Review of Systems  Constitutional: Negative.   Respiratory: Negative.   Cardiovascular: Negative.     Blood pressure 120/74, pulse 72, resp. rate 12, height 5\' 5"  (1.651 m), weight 153 lb (69.4 kg).  Physical Exam Physical Exam  Constitutional: She is oriented to person, place, and time. She appears  well-developed and well-nourished.  Eyes: Conjunctivae are normal. No scleral icterus.  Neck: Neck supple.  Cardiovascular: Normal rate, regular rhythm and normal heart sounds.  Pulmonary/Chest: Effort normal and breath sounds normal. Right breast exhibits no inverted nipple, no mass, no nipple discharge, no skin change and no tenderness. Left breast exhibits no inverted nipple, no mass, no nipple discharge, no skin change and no tenderness.  Abdominal: Soft. Normal appearance and bowel sounds are normal. There is no tenderness. A hernia (small umbilical hernia) is present.  Lymphadenopathy:    She has no cervical adenopathy.    She has no axillary adenopathy.  Neurological: She is alert and oriented to person, place, and time.  Skin: Skin is warm and dry.    Data Reviewed Mammogram reviewed   Assessment    Stable exam.  History of [place]      Plan     Patient will be asked to return to  her PCP in one year with a bilateral screening mammogram. The patient is aware to call back for any questions or concerns.  HPI, Physical Exam, Assessment and Plan have been scribed under the direction and in the presence of Mckinley Jewel, MD  Gaspar Cola, CMA    I have completed the exam and reviewed  the above documentation for accuracy and completeness.  I agree with the above.  Haematologist has been used and any errors in dictation or transcription are unintentional.  Seeplaputhur G. Jamal Collin, M.D., F.A.C.S.    Junie Panning G 01/03/2017, 12:41 PM

## 2017-12-07 ENCOUNTER — Other Ambulatory Visit: Payer: Self-pay | Admitting: Family Medicine

## 2017-12-07 DIAGNOSIS — Z1231 Encounter for screening mammogram for malignant neoplasm of breast: Secondary | ICD-10-CM

## 2017-12-15 ENCOUNTER — Ambulatory Visit
Admission: RE | Admit: 2017-12-15 | Discharge: 2017-12-15 | Disposition: A | Discharge status: Home / Self Care | Payer: BLUE CROSS/BLUE SHIELD | Source: Ambulatory Visit | Attending: Family Medicine | Admitting: Family Medicine

## 2017-12-15 DIAGNOSIS — Z1231 Encounter for screening mammogram for malignant neoplasm of breast: Secondary | ICD-10-CM | POA: Insufficient documentation

## 2018-11-08 ENCOUNTER — Other Ambulatory Visit: Payer: Self-pay | Admitting: Family Medicine

## 2018-11-08 DIAGNOSIS — Z1231 Encounter for screening mammogram for malignant neoplasm of breast: Secondary | ICD-10-CM

## 2018-12-19 ENCOUNTER — Ambulatory Visit
Admission: RE | Admit: 2018-12-19 | Discharge: 2018-12-19 | Disposition: A | Discharge status: Home / Self Care | Payer: BC Managed Care – PPO | Source: Ambulatory Visit | Attending: Family Medicine | Admitting: Family Medicine

## 2018-12-19 DIAGNOSIS — Z1231 Encounter for screening mammogram for malignant neoplasm of breast: Secondary | ICD-10-CM | POA: Diagnosis not present

## 2019-04-14 ENCOUNTER — Ambulatory Visit: Payer: Self-pay | Attending: Internal Medicine

## 2019-04-14 DIAGNOSIS — Z23 Encounter for immunization: Secondary | ICD-10-CM

## 2019-04-14 NOTE — Progress Notes (Signed)
   Covid-19 Vaccination Clinic  Name:  LELER DRAPER    MRN: [id number] DOB: 27-Jul-1970  04/14/2019  Ms. Kassam was observed post Covid-19 immunization for 15 minutes without incident. She was provided with Vaccine Information Sheet and instruction to access the V-Safe system.   Ms. Nguon was instructed to call 911 with any severe reactions post vaccine: Marland Kitchen Difficulty breathing  . Swelling of face and throat  . A fast heartbeat  . A bad rash all over body  . Dizziness and weakness   Immunizations Administered    Name Date Dose VIS Date Route   Pfizer COVID-19 Vaccine 04/14/2019  1:30 PM 0.3 mL 01/06/2019 Intramuscular   Manufacturer: [place]   Lot: [id number]   [place]: [id number]

## 2019-05-05 ENCOUNTER — Ambulatory Visit: Payer: Self-pay | Attending: Internal Medicine

## 2019-05-05 DIAGNOSIS — Z23 Encounter for immunization: Secondary | ICD-10-CM

## 2019-05-05 NOTE — Progress Notes (Signed)
   Covid-19 Vaccination Clinic  Name:  Amber Navarro    MRN: [id number] DOB: 01/21/1971  05/05/2019  Amber Navarro was observed post Covid-19 immunization for 15 minutes without incident. She was provided with Vaccine Information Sheet and instruction to access the V-Safe system.   Amber Navarro was instructed to call 911 with any severe reactions post vaccine: Marland Kitchen Difficulty breathing  . Swelling of face and throat  . A fast heartbeat  . A bad rash all over body  . Dizziness and weakness   Immunizations Administered    Name Date Dose VIS Date Route   Pfizer COVID-19 Vaccine 05/05/2019  2:12 PM 0.3 mL 01/06/2019 Intramuscular   Manufacturer: [place]   Lot: [id number]   [place]: [id number]

## 2020-03-15 ENCOUNTER — Other Ambulatory Visit: Payer: Self-pay | Admitting: Family Medicine

## 2020-03-15 DIAGNOSIS — Z1231 Encounter for screening mammogram for malignant neoplasm of breast: Secondary | ICD-10-CM

## 2020-04-02 ENCOUNTER — Ambulatory Visit
Admission: RE | Admit: 2020-04-02 | Discharge: 2020-04-02 | Disposition: A | Discharge status: Home / Self Care | Payer: BC Managed Care – PPO | Source: Ambulatory Visit | Attending: Family Medicine | Admitting: Family Medicine

## 2020-04-02 ENCOUNTER — Other Ambulatory Visit: Payer: Self-pay

## 2020-04-02 DIAGNOSIS — Z1231 Encounter for screening mammogram for malignant neoplasm of breast: Secondary | ICD-10-CM | POA: Insufficient documentation

## 2020-11-06 ENCOUNTER — Other Ambulatory Visit (HOSPITAL_COMMUNITY): Payer: Self-pay

## 2020-11-06 MED ORDER — INFLUENZA VAC SPLIT QUAD 0.5 ML IM SUSY
PREFILLED_SYRINGE | INTRAMUSCULAR | 0 refills | Status: AC
  Filled 2020-11-06: qty 0.5, 1d supply, fill #0

## 2021-03-04 ENCOUNTER — Other Ambulatory Visit: Payer: Self-pay | Admitting: Family Medicine

## 2021-03-04 DIAGNOSIS — Z1231 Encounter for screening mammogram for malignant neoplasm of breast: Secondary | ICD-10-CM

## 2021-04-09 ENCOUNTER — Ambulatory Visit
Admission: RE | Admit: 2021-04-09 | Discharge: 2021-04-09 | Disposition: A | Discharge status: Home / Self Care | Payer: BC Managed Care – PPO | Source: Ambulatory Visit | Attending: Family Medicine | Admitting: Family Medicine

## 2021-04-09 ENCOUNTER — Other Ambulatory Visit: Payer: Self-pay

## 2021-04-09 DIAGNOSIS — Z1231 Encounter for screening mammogram for malignant neoplasm of breast: Secondary | ICD-10-CM | POA: Insufficient documentation

## 2021-08-02 IMAGING — MG MM DIGITAL SCREENING BILAT W/ TOMO AND CAD
6 of 10 series · 6 of 30 positions shown · non-contrast
Comparison: Previous exam(s).

CLINICAL DATA: Screening.

EXAM:
DIGITAL SCREENING BILATERAL MAMMOGRAM WITH TOMOSYNTHESIS AND CAD
TECHNIQUE: Bilateral screening digital craniocaudal and mediolateral oblique
mammograms were obtained. Bilateral screening digital breast
tomosynthesis was performed. The images were evaluated with
computer-aided detection.

[R MLO synth-2D (1 of 2)]
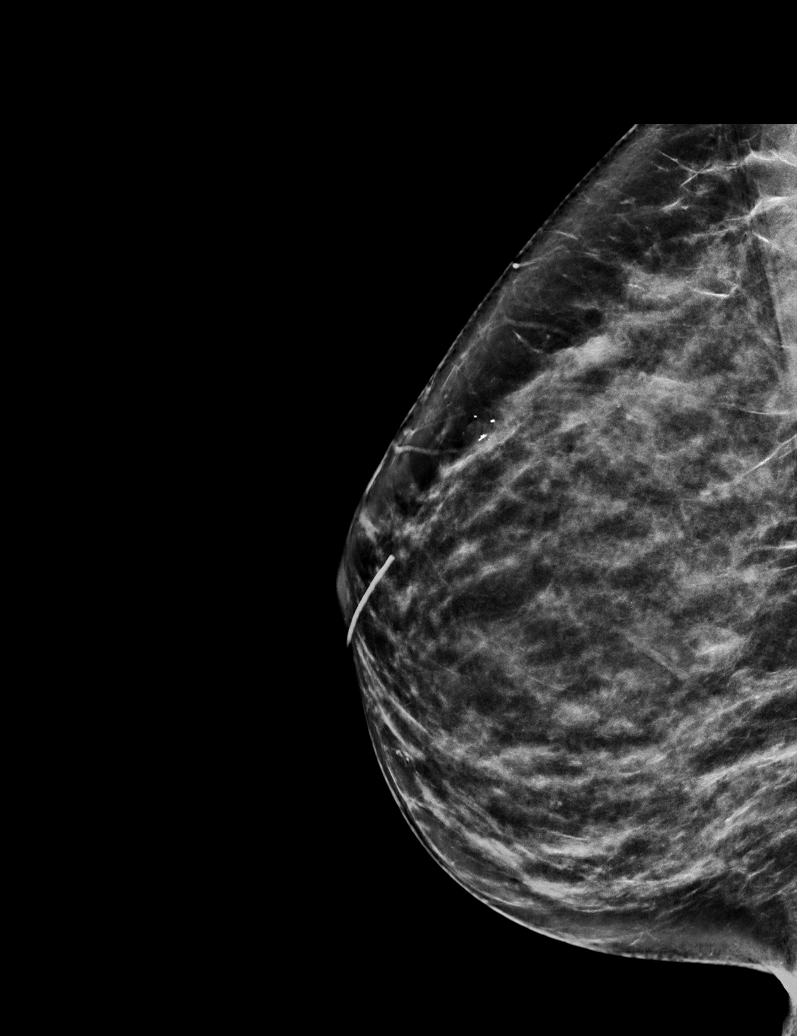

[R MLO synth-2D (2 of 2)]
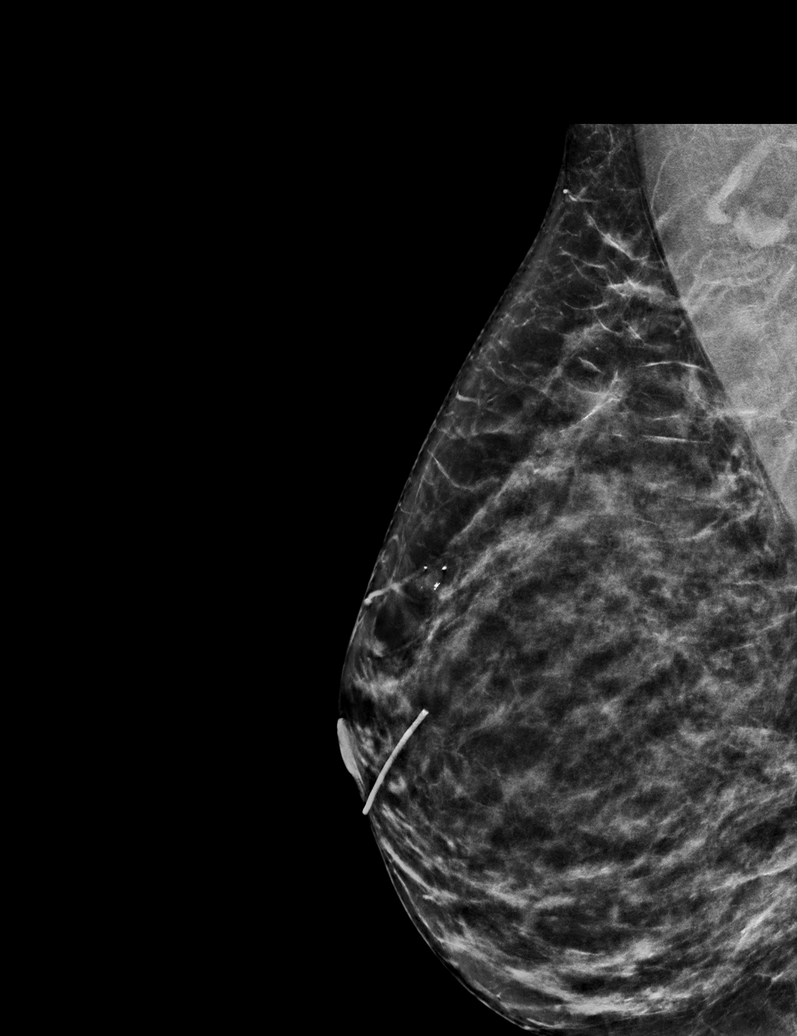

[L CC synth-2D]
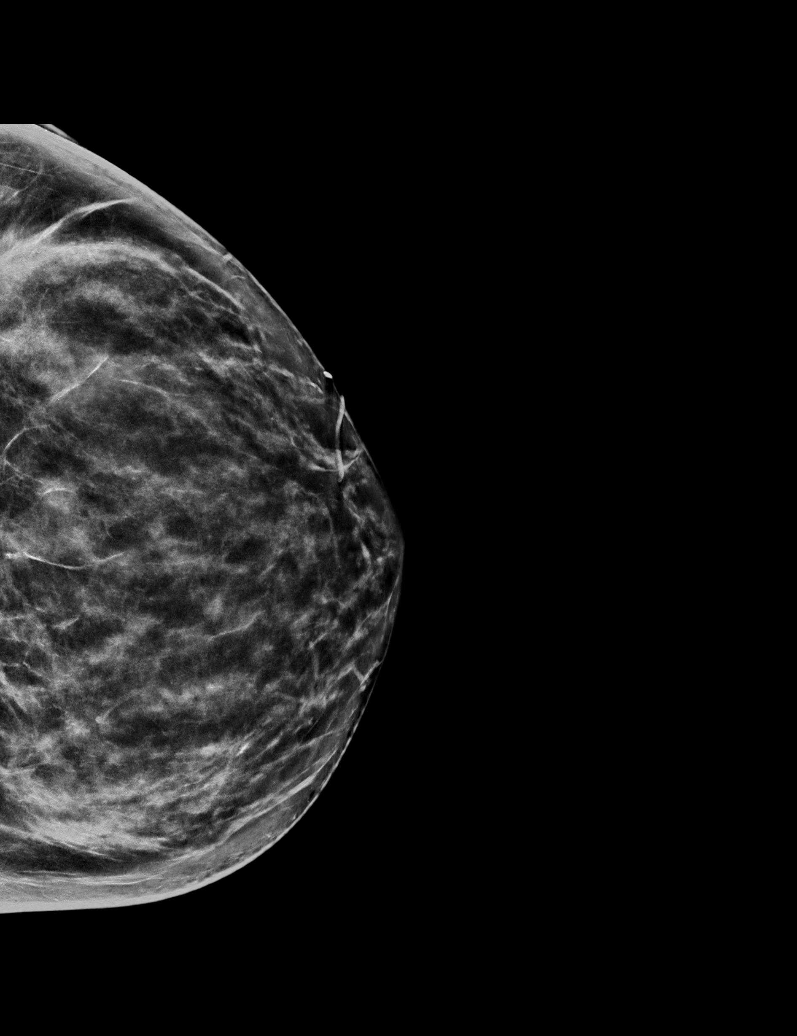

[L MLO synth-2D]
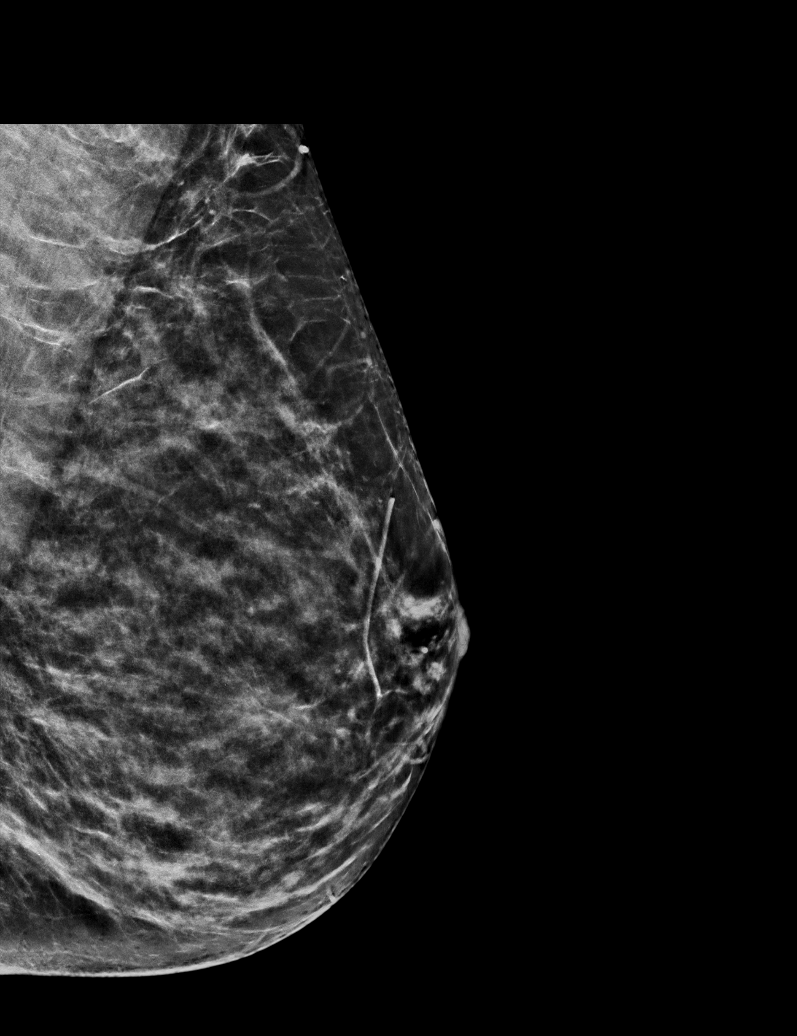

[R CC synth-2D]
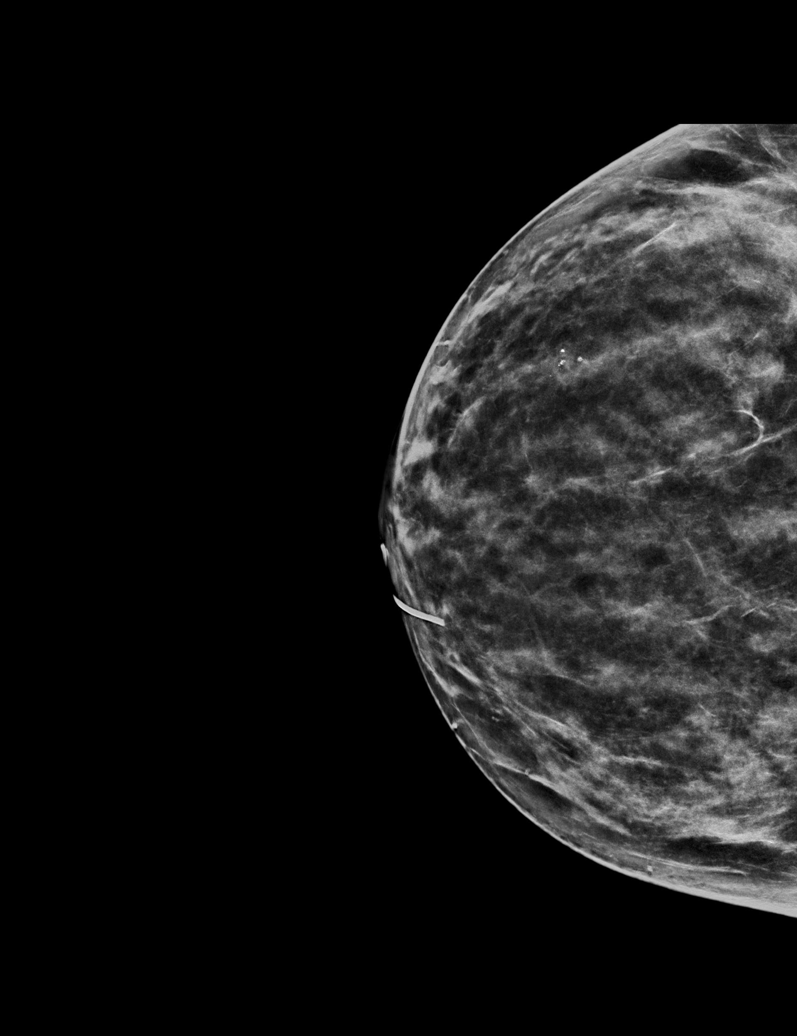

[R MLO tomo · tomo slice 35/68.0]
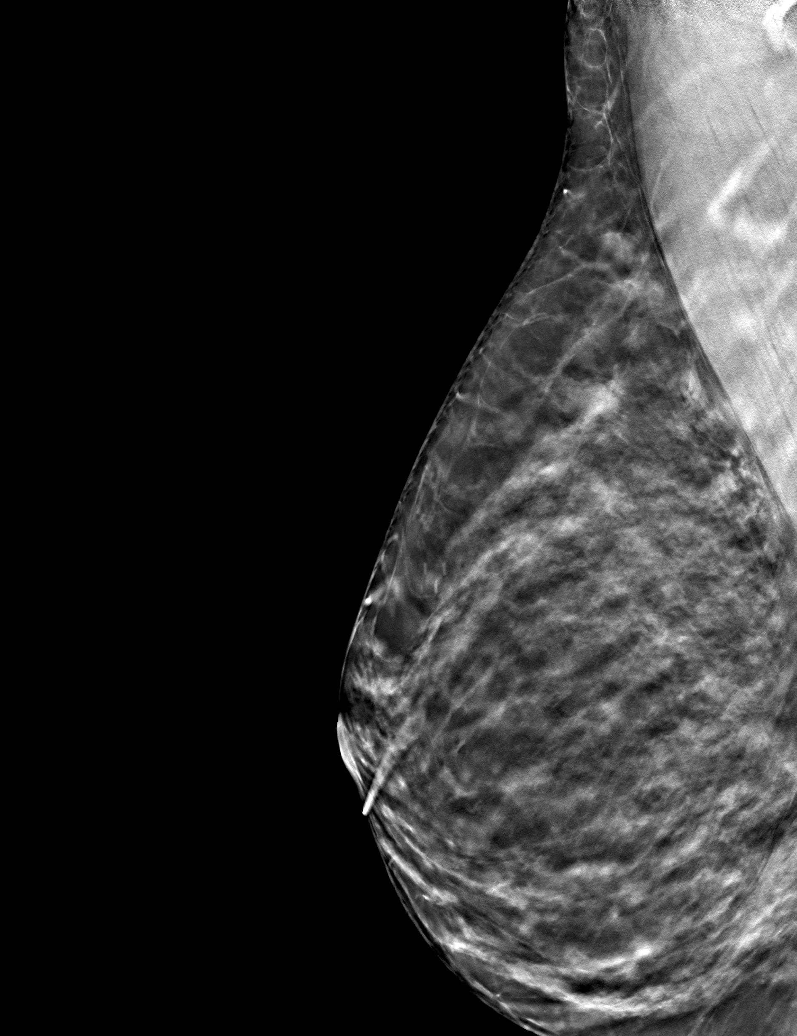

[6 of 30 positions shown; findings below may reference images not displayed]

ACR Breast Density Category c: The breast tissue is heterogeneously
dense, which may obscure small masses.
FINDINGS: There are no findings suspicious for malignancy. The images were
evaluated with computer-aided detection.
IMPRESSION: No mammographic evidence of malignancy. A result letter of this
screening mammogram will be mailed directly to the patient.

RECOMMENDATION:
Screening mammogram in one year. (Code:T4-5-GWO)

BI-RADS CATEGORY  1: Negative.

## 2022-02-24 ENCOUNTER — Other Ambulatory Visit: Payer: Self-pay | Admitting: Family Medicine

## 2022-02-24 DIAGNOSIS — Z1231 Encounter for screening mammogram for malignant neoplasm of breast: Secondary | ICD-10-CM

## 2022-04-14 ENCOUNTER — Ambulatory Visit
Admission: RE | Admit: 2022-04-14 | Discharge: 2022-04-14 | Disposition: A | Discharge status: Home / Self Care | Payer: BC Managed Care – PPO | Source: Ambulatory Visit | Attending: Family Medicine | Admitting: Family Medicine

## 2022-04-14 DIAGNOSIS — Z1231 Encounter for screening mammogram for malignant neoplasm of breast: Secondary | ICD-10-CM | POA: Insufficient documentation

## 2023-02-22 ENCOUNTER — Other Ambulatory Visit: Payer: Self-pay | Admitting: Family Medicine

## 2023-02-22 DIAGNOSIS — Z1231 Encounter for screening mammogram for malignant neoplasm of breast: Secondary | ICD-10-CM

## 2023-04-16 ENCOUNTER — Ambulatory Visit
Admission: RE | Admit: 2023-04-16 | Discharge: 2023-04-16 | Disposition: A | Discharge status: Home / Self Care | Payer: PRIVATE HEALTH INSURANCE | Source: Ambulatory Visit | Attending: Family Medicine | Admitting: Family Medicine

## 2023-04-16 DIAGNOSIS — Z1231 Encounter for screening mammogram for malignant neoplasm of breast: Secondary | ICD-10-CM | POA: Diagnosis present

## 2023-11-25 ENCOUNTER — Ambulatory Visit
Admission: RE | Admit: 2023-11-25 | Discharge: 2023-11-25 | Disposition: A | Source: Ambulatory Visit | Attending: Urgent Care | Admitting: Urgent Care

## 2023-11-25 ENCOUNTER — Other Ambulatory Visit: Payer: Self-pay | Admitting: Urgent Care

## 2023-11-25 DIAGNOSIS — R052 Subacute cough: Secondary | ICD-10-CM
